# Patient Record
Sex: Female | Born: 1975 | Race: Black or African American | Hispanic: No | Marital: Married | Smoking: Current every day smoker
Health system: Southern US, Community
[De-identification: ages and names within clinical notes are randomized; demographics above are authoritative.]

## PROBLEM LIST (undated history)

## (undated) DIAGNOSIS — T8859XA Other complications of anesthesia, initial encounter: Secondary | ICD-10-CM

## (undated) DIAGNOSIS — D649 Anemia, unspecified: Secondary | ICD-10-CM

## (undated) DIAGNOSIS — R569 Unspecified convulsions: Secondary | ICD-10-CM

## (undated) DIAGNOSIS — J45909 Unspecified asthma, uncomplicated: Secondary | ICD-10-CM

## (undated) HISTORY — PX: OTHER SURGICAL HISTORY: SHX169

## (undated) HISTORY — PX: IMPLANTATION VAGAL NERVE STIMULATOR: SUR692

---

## 2020-02-24 ENCOUNTER — Other Ambulatory Visit: Payer: Self-pay

## 2020-02-24 ENCOUNTER — Other Ambulatory Visit (HOSPITAL_COMMUNITY): Payer: Self-pay | Admitting: Preventative Medicine

## 2020-02-24 ENCOUNTER — Ambulatory Visit (HOSPITAL_COMMUNITY)
Admission: RE | Admit: 2020-02-24 | Discharge: 2020-02-24 | Disposition: A | Discharge status: Home / Self Care | Payer: Medicare Other | Source: Ambulatory Visit | Attending: Preventative Medicine | Admitting: Preventative Medicine

## 2020-02-24 DIAGNOSIS — M79642 Pain in left hand: Secondary | ICD-10-CM

## 2020-03-02 DIAGNOSIS — S5332XA Traumatic rupture of left ulnar collateral ligament, initial encounter: Secondary | ICD-10-CM | POA: Insufficient documentation

## 2020-03-02 DIAGNOSIS — S63642A Sprain of metacarpophalangeal joint of left thumb, initial encounter: Secondary | ICD-10-CM | POA: Insufficient documentation

## 2020-03-03 ENCOUNTER — Other Ambulatory Visit: Payer: Self-pay | Admitting: Orthopedic Surgery

## 2020-03-04 ENCOUNTER — Other Ambulatory Visit: Payer: Self-pay | Admitting: Orthopedic Surgery

## 2020-03-04 DIAGNOSIS — S5332XA Traumatic rupture of left ulnar collateral ligament, initial encounter: Secondary | ICD-10-CM

## 2020-03-04 DIAGNOSIS — S63642A Sprain of metacarpophalangeal joint of left thumb, initial encounter: Secondary | ICD-10-CM

## 2020-03-18 ENCOUNTER — Other Ambulatory Visit: Payer: Self-pay | Admitting: Orthopedic Surgery

## 2020-03-18 ENCOUNTER — Other Ambulatory Visit: Payer: Self-pay | Admitting: Hematology & Oncology

## 2020-03-18 DIAGNOSIS — S63642A Sprain of metacarpophalangeal joint of left thumb, initial encounter: Secondary | ICD-10-CM

## 2020-03-18 DIAGNOSIS — S5332XA Traumatic rupture of left ulnar collateral ligament, initial encounter: Secondary | ICD-10-CM

## 2020-03-30 ENCOUNTER — Inpatient Hospital Stay: Admission: RE | Admit: 2020-03-30 | Payer: Medicare Other | Source: Ambulatory Visit

## 2020-04-24 ENCOUNTER — Other Ambulatory Visit: Payer: Self-pay | Admitting: Orthopedic Surgery

## 2020-04-24 DIAGNOSIS — S5332XA Traumatic rupture of left ulnar collateral ligament, initial encounter: Secondary | ICD-10-CM

## 2020-04-24 DIAGNOSIS — S63642A Sprain of metacarpophalangeal joint of left thumb, initial encounter: Secondary | ICD-10-CM

## 2020-04-28 ENCOUNTER — Emergency Department (HOSPITAL_COMMUNITY): Payer: Medicare HMO

## 2020-04-28 ENCOUNTER — Other Ambulatory Visit: Payer: Self-pay

## 2020-04-28 ENCOUNTER — Encounter (HOSPITAL_COMMUNITY): Payer: Self-pay

## 2020-04-28 ENCOUNTER — Emergency Department (HOSPITAL_COMMUNITY)
Admission: EM | Admit: 2020-04-28 | Discharge: 2020-04-28 | Disposition: A | Discharge status: Home / Self Care | Payer: Medicare HMO | Attending: Emergency Medicine | Admitting: Emergency Medicine

## 2020-04-28 DIAGNOSIS — R059 Cough, unspecified: Secondary | ICD-10-CM | POA: Insufficient documentation

## 2020-04-28 DIAGNOSIS — R0989 Other specified symptoms and signs involving the circulatory and respiratory systems: Secondary | ICD-10-CM | POA: Diagnosis not present

## 2020-04-28 DIAGNOSIS — Z5321 Procedure and treatment not carried out due to patient leaving prior to being seen by health care provider: Secondary | ICD-10-CM | POA: Insufficient documentation

## 2020-04-28 DIAGNOSIS — Z20822 Contact with and (suspected) exposure to covid-19: Secondary | ICD-10-CM | POA: Diagnosis not present

## 2020-04-28 DIAGNOSIS — R1310 Dysphagia, unspecified: Secondary | ICD-10-CM

## 2020-04-28 HISTORY — DX: Unspecified convulsions: R56.9

## 2020-04-28 LAB — CBC WITH DIFFERENTIAL/PLATELET
Abs Immature Granulocytes: 0.02 10*3/uL (ref 0.00–0.07)
Basophils Absolute: 0.1 10*3/uL (ref 0.0–0.1)
Basophils Relative: 1 %
Eosinophils Absolute: 0.1 10*3/uL (ref 0.0–0.5)
Eosinophils Relative: 2 %
HCT: 35.5 % — ABNORMAL LOW (ref 36.0–46.0)
Hemoglobin: 10.8 g/dL — ABNORMAL LOW (ref 12.0–15.0)
Immature Granulocytes: 0 %
Lymphocytes Relative: 18 %
Lymphs Abs: 1.3 10*3/uL (ref 0.7–4.0)
MCH: 22.4 pg — ABNORMAL LOW (ref 26.0–34.0)
MCHC: 30.4 g/dL (ref 30.0–36.0)
MCV: 73.7 fL — ABNORMAL LOW (ref 80.0–100.0)
Monocytes Absolute: 0.5 10*3/uL (ref 0.1–1.0)
Monocytes Relative: 7 %
Neutro Abs: 5 10*3/uL (ref 1.7–7.7)
Neutrophils Relative %: 72 %
Platelets: 248 10*3/uL (ref 150–400)
RBC: 4.82 MIL/uL (ref 3.87–5.11)
RDW: 15.1 % (ref 11.5–15.5)
WBC: 7 10*3/uL (ref 4.0–10.5)
nRBC: 0 % (ref 0.0–0.2)

## 2020-04-28 LAB — COMPREHENSIVE METABOLIC PANEL
ALT: 11 U/L (ref 0–44)
AST: 20 U/L (ref 15–41)
Albumin: 3.8 g/dL (ref 3.5–5.0)
Alkaline Phosphatase: 43 U/L (ref 38–126)
Anion gap: 7 (ref 5–15)
BUN: 8 mg/dL (ref 6–20)
CO2: 25 mmol/L (ref 22–32)
Calcium: 8.8 mg/dL — ABNORMAL LOW (ref 8.9–10.3)
Chloride: 106 mmol/L (ref 98–111)
Creatinine, Ser: 0.87 mg/dL (ref 0.44–1.00)
GFR, Estimated: >60 mL/min (ref >60)
Glucose, Bld: 75 mg/dL (ref 70–99)
Potassium: 4.3 mmol/L (ref 3.5–5.1)
Sodium: 138 mmol/L (ref 135–145)
Total Bilirubin: 0.5 mg/dL (ref 0.3–1.2)
Total Protein: 7 g/dL (ref 6.5–8.1)

## 2020-04-28 LAB — RESP PANEL BY RT-PCR (FLU A&B, COVID) ARPGX2
Influenza A by PCR: NEGATIVE
Influenza B by PCR: NEGATIVE
SARS Coronavirus 2 by RT PCR: NEGATIVE

## 2020-04-28 MED ORDER — PANTOPRAZOLE SODIUM 40 MG IV SOLR
40.0000 mg | Freq: Once | INTRAVENOUS | Status: AC
  Administered 2020-04-28: 40 mg via INTRAVENOUS
  Filled 2020-04-28: qty 40

## 2020-04-28 MED ORDER — GLUCAGON HCL RDNA (DIAGNOSTIC) 1 MG IJ SOLR
1.0000 mg | Freq: Once | INTRAMUSCULAR | Status: AC
  Administered 2020-04-28: 1 mg via INTRAVENOUS
  Filled 2020-04-28: qty 1

## 2020-04-28 MED ORDER — SODIUM CHLORIDE 0.9 % IV BOLUS
1000.0000 mL | Freq: Once | INTRAVENOUS | Status: AC
  Administered 2020-04-28: 1000 mL via INTRAVENOUS

## 2020-04-28 NOTE — ED Triage Notes (Signed)
Pt reports productive cough with brownish/green sputum x 3 weeks.  This morning woke up feeling like something is in her throat.  Reports able to swallow liquid but chokes on food.  Reports had covid test 2 weeks ago and was negative.  Denies any fever or chills.  Denies any sob.

## 2020-04-28 NOTE — ED Provider Notes (Signed)
[facility] EMERGENCY DEPARTMENT Provider Note   CSN: [id number] Arrival date & time: 04/28/20  0848     History Chief Complaint  Patient presents with  . Cough    Annette Vasquez is a 45 y.o. female.  Patient states she is having difficulty swallowing.  This has been going on for a number of days.  She can swallow liquids but have problems with food  The history is provided by the patient and medical records.  Sore Throat This is a new problem. The problem occurs constantly. The problem has not changed since onset.Pertinent negatives include no chest pain, no abdominal pain and no headaches. Nothing aggravates the symptoms. Nothing relieves the symptoms. She has tried nothing for the symptoms. The treatment provided no relief.       Past Medical History:  Diagnosis Date  . Seizures [facility])     Patient Active Problem List   Diagnosis Date Noted  . 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 IMPLANTATION VAGAL NERVE STIMULATOR    . r thumb surgery       OB History   No obstetric history on file.     No family history on file.  Social History   Tobacco Use  . Smoking status: Current Every Day Smoker  . Smokeless tobacco: Never Used  Vaping Use  . Vaping Use: Never used  Substance Use Topics  . Alcohol use: Never  . Drug use: Never    Home Medications Prior to Admission medications   Not on File    Allergies    Iodinated diagnostic agents and Betadine [povidone iodine]  Review of Systems   Review of Systems  Constitutional: Negative for appetite change and fatigue.  HENT: Negative for congestion, ear discharge and sinus pressure.        Swallowing problems  Eyes: Negative for discharge.  Respiratory: Negative for cough.   Cardiovascular: Negative for chest pain.  Gastrointestinal: Negative for abdominal pain and diarrhea.  Genitourinary: Negative for frequency and hematuria.   Musculoskeletal: Negative for back pain.  Skin: Negative for rash.  Neurological: Negative for seizures and headaches.  Psychiatric/Behavioral: Negative for hallucinations.    Physical Exam Updated Vital Signs BP 91/60   Pulse 63   Temp 98.2 F (36.8 C) (Oral)   Resp 16   Ht 5\' 3"  (1.6 m)   Wt 54.4 kg   LMP 04/21/2020   SpO2 100%   BMI 21.26 kg/m   Physical Exam Vitals and nursing note reviewed.  Constitutional:      Appearance: She is well-developed.  HENT:     Head: Normocephalic.     Nose: Nose normal.  Eyes:     General: No scleral icterus.    Extraocular Movements: EOM normal.     Conjunctiva/sclera: Conjunctivae normal.  Neck:     Thyroid: No thyromegaly.  Cardiovascular:     Rate and Rhythm: Normal rate and regular rhythm.     Heart sounds: No murmur heard. No friction rub. No gallop.   Pulmonary:     Breath sounds: No stridor. No wheezing or rales.  Chest:     Chest wall: No tenderness.  Abdominal:     General: There is no distension.     Tenderness: There is no abdominal tenderness. There is no rebound.  Musculoskeletal:        General: No edema. Normal range of motion.     Cervical back: Neck supple.  Lymphadenopathy:     Cervical: No cervical adenopathy.  Skin:    Findings: No erythema or rash.  Neurological:     Mental Status: She is alert and oriented to person, place, and time.     Motor: No abnormal muscle tone.     Coordination: Coordination normal.  Psychiatric:        Mood and Affect: Mood and affect normal.        Behavior: Behavior normal.     ED Results / Procedures / Treatments   Labs (all labs ordered are listed, but only abnormal results are displayed) Labs Reviewed  CBC WITH DIFFERENTIAL/PLATELET - Abnormal; Notable for the following components:      Result Value   Hemoglobin 10.8 (*)    HCT 35.5 (*)    MCV 73.7 (*)    MCH 22.4 (*)    All other components within normal limits  COMPREHENSIVE METABOLIC PANEL - Abnormal;  Notable for the following components:   Calcium 8.8 (*)    All other components within normal limits  RESP PANEL BY RT-PCR (FLU A&B, COVID) [id number]    EKG None  Radiology DG Chest Port 1 View  Result Date: 04/28/2020 CLINICAL DATA:  Productive cough. EXAM: PORTABLE CHEST 1 VIEW COMPARISON:  No prior. FINDINGS: Delayed device noted chest. Mediastinum hilar structures normal. No focal alveolar infiltrate. No prominent pleural effusion. No pneumothorax. Upper most portion of the apices not completely imaged. No acute bony abnormality. IMPRESSION: No acute cardiopulmonary disease. Electronically Signed   By: Maisie Fus  Register   On: 04/28/2020 10:19    Procedures Procedures (including critical care time)  Medications Ordered in ED Medications  glucagon (human recombinant) (GLUCAGEN) injection 1 mg (1 mg Intravenous Given 04/28/20 1027)  pantoprazole (PROTONIX) injection 40 mg (40 mg Intravenous Given 04/28/20 1027)  sodium chloride 0.9 % bolus 1,000 mL (0 mLs Intravenous Stopped 04/28/20 1326)    ED Course  I have reviewed the triage vital signs and the nursing notes.  Pertinent labs & imaging results that were available during my care of the patient were reviewed by me and considered in my medical decision making (see chart for details).    MDM Rules/Calculators/A&P                          Patient with difficult swallowing.  She was awaiting a CT of her neck and patient left AMA before she had the CAT scan done Final Clinical Impression(s) / ED Diagnoses Final diagnoses:  None    Rx / DC Orders ED Discharge Orders    None       Bethann Berkshire, MD 05/02/20 1009

## 2020-04-28 NOTE — ED Notes (Signed)
Pt states she feels no different after IV glucagon

## 2020-05-12 ENCOUNTER — Ambulatory Visit
Admission: RE | Admit: 2020-05-12 | Discharge: 2020-05-12 | Disposition: A | Discharge status: Home / Self Care | Payer: Medicare Other | Source: Ambulatory Visit | Attending: Orthopedic Surgery | Admitting: Orthopedic Surgery

## 2020-05-12 DIAGNOSIS — S5332XA Traumatic rupture of left ulnar collateral ligament, initial encounter: Secondary | ICD-10-CM

## 2020-05-12 DIAGNOSIS — S63642A Sprain of metacarpophalangeal joint of left thumb, initial encounter: Secondary | ICD-10-CM

## 2020-05-19 ENCOUNTER — Other Ambulatory Visit: Payer: Self-pay | Admitting: Orthopedic Surgery

## 2020-06-03 ENCOUNTER — Other Ambulatory Visit: Payer: Self-pay

## 2020-06-03 ENCOUNTER — Encounter (HOSPITAL_BASED_OUTPATIENT_CLINIC_OR_DEPARTMENT_OTHER): Payer: Self-pay | Admitting: Orthopedic Surgery

## 2020-06-08 ENCOUNTER — Other Ambulatory Visit (HOSPITAL_COMMUNITY)
Admission: RE | Admit: 2020-06-08 | Discharge: 2020-06-08 | Disposition: A | Discharge status: Home / Self Care | Payer: Medicare HMO | Source: Ambulatory Visit | Attending: Orthopedic Surgery | Admitting: Orthopedic Surgery

## 2020-06-08 DIAGNOSIS — Z01812 Encounter for preprocedural laboratory examination: Secondary | ICD-10-CM | POA: Diagnosis present

## 2020-06-08 DIAGNOSIS — Z20822 Contact with and (suspected) exposure to covid-19: Secondary | ICD-10-CM | POA: Insufficient documentation

## 2020-06-08 LAB — SARS CORONAVIRUS 2 (TAT 6-24 HRS): SARS Coronavirus 2: NEGATIVE

## 2020-06-08 NOTE — Progress Notes (Signed)

## 2020-06-10 ENCOUNTER — Encounter (HOSPITAL_BASED_OUTPATIENT_CLINIC_OR_DEPARTMENT_OTHER): Payer: Self-pay | Admitting: Orthopedic Surgery

## 2020-06-11 ENCOUNTER — Ambulatory Visit (HOSPITAL_BASED_OUTPATIENT_CLINIC_OR_DEPARTMENT_OTHER): Payer: Medicare HMO | Admitting: Certified Registered"

## 2020-06-11 ENCOUNTER — Encounter (HOSPITAL_BASED_OUTPATIENT_CLINIC_OR_DEPARTMENT_OTHER)
Admission: RE | Disposition: A | Discharge status: Home / Self Care | Payer: Self-pay | Source: Home / Self Care | Attending: Orthopedic Surgery

## 2020-06-11 ENCOUNTER — Ambulatory Visit (HOSPITAL_BASED_OUTPATIENT_CLINIC_OR_DEPARTMENT_OTHER)
Admission: RE | Admit: 2020-06-11 | Discharge: 2020-06-11 | Disposition: A | Discharge status: Home / Self Care | Payer: Medicare HMO | Attending: Orthopedic Surgery | Admitting: Orthopedic Surgery

## 2020-06-11 ENCOUNTER — Encounter (HOSPITAL_BASED_OUTPATIENT_CLINIC_OR_DEPARTMENT_OTHER): Payer: Self-pay | Admitting: Orthopedic Surgery

## 2020-06-11 ENCOUNTER — Other Ambulatory Visit: Payer: Self-pay

## 2020-06-11 DIAGNOSIS — Z91041 Radiographic dye allergy status: Secondary | ICD-10-CM | POA: Diagnosis not present

## 2020-06-11 DIAGNOSIS — Z885 Allergy status to narcotic agent status: Secondary | ICD-10-CM | POA: Insufficient documentation

## 2020-06-11 DIAGNOSIS — Z881 Allergy status to other antibiotic agents status: Secondary | ICD-10-CM | POA: Diagnosis not present

## 2020-06-11 DIAGNOSIS — F17200 Nicotine dependence, unspecified, uncomplicated: Secondary | ICD-10-CM | POA: Insufficient documentation

## 2020-06-11 DIAGNOSIS — X58XXXA Exposure to other specified factors, initial encounter: Secondary | ICD-10-CM | POA: Diagnosis not present

## 2020-06-11 DIAGNOSIS — S63642A Sprain of metacarpophalangeal joint of left thumb, initial encounter: Secondary | ICD-10-CM | POA: Diagnosis present

## 2020-06-11 HISTORY — DX: Other complications of anesthesia, initial encounter: T88.59XA

## 2020-06-11 HISTORY — PX: LIGAMENT REPAIR: SHX5444

## 2020-06-11 HISTORY — DX: Unspecified asthma, uncomplicated: J45.909

## 2020-06-11 HISTORY — DX: Anemia, unspecified: D64.9

## 2020-06-11 LAB — POCT PREGNANCY, URINE: Preg Test, Ur: NEGATIVE

## 2020-06-11 SURGERY — REPAIR, LIGAMENT
Anesthesia: Monitor Anesthesia Care | Site: Thumb | Laterality: Left

## 2020-06-11 MED ORDER — PROPOFOL 500 MG/50ML IV EMUL
INTRAVENOUS | Status: AC
  Filled 2020-06-11: qty 50

## 2020-06-11 MED ORDER — FENTANYL CITRATE (PF) 100 MCG/2ML IJ SOLN
INTRAMUSCULAR | Status: AC
  Filled 2020-06-11: qty 2

## 2020-06-11 MED ORDER — PROPOFOL 500 MG/50ML IV EMUL
INTRAVENOUS | Status: DC | PRN
  Administered 2020-06-11: 120 ug/kg/min via INTRAVENOUS
  Administered 2020-06-11: 20 mg via INTRAVENOUS

## 2020-06-11 MED ORDER — CEFAZOLIN SODIUM-DEXTROSE 2-4 GM/100ML-% IV SOLN
INTRAVENOUS | Status: AC
  Filled 2020-06-11: qty 100

## 2020-06-11 MED ORDER — LACTATED RINGERS IV SOLN: INTRAVENOUS | Status: DC

## 2020-06-11 MED ORDER — PHENYLEPHRINE HCL (PRESSORS) 10 MG/ML IV SOLN
INTRAVENOUS | Status: DC | PRN
  Administered 2020-06-11: 40 ug via INTRAVENOUS

## 2020-06-11 MED ORDER — MIDAZOLAM HCL 2 MG/2ML IJ SOLN
2.0000 mg | Freq: Once | INTRAMUSCULAR | Status: AC
  Administered 2020-06-11: 2 mg via INTRAVENOUS

## 2020-06-11 MED ORDER — CEFAZOLIN SODIUM-DEXTROSE 2-4 GM/100ML-% IV SOLN
2.0000 g | INTRAVENOUS | Status: AC
  Administered 2020-06-11: 2 g via INTRAVENOUS

## 2020-06-11 MED ORDER — FENTANYL CITRATE (PF) 100 MCG/2ML IJ SOLN: 25.0000 ug | INTRAMUSCULAR | Status: DC | PRN

## 2020-06-11 MED ORDER — MIDAZOLAM HCL 2 MG/2ML IJ SOLN
INTRAMUSCULAR | Status: AC
  Filled 2020-06-11: qty 2

## 2020-06-11 MED ORDER — HYDROCODONE-ACETAMINOPHEN 5-325 MG PO TABS: 1.0000 | ORAL_TABLET | Freq: Four times a day (QID) | ORAL | 0 refills | Status: AC | PRN

## 2020-06-11 MED ORDER — FENTANYL CITRATE (PF) 100 MCG/2ML IJ SOLN
INTRAMUSCULAR | Status: DC | PRN
  Administered 2020-06-11: 25 ug via INTRAVENOUS

## 2020-06-11 MED ORDER — BUPIVACAINE-EPINEPHRINE (PF) 0.5% -1:200000 IJ SOLN
INTRAMUSCULAR | Status: DC | PRN
  Administered 2020-06-11: 20 mL via PERINEURAL

## 2020-06-11 MED ORDER — 0.9 % SODIUM CHLORIDE (POUR BTL) OPTIME
TOPICAL | Status: DC | PRN
  Administered 2020-06-11: 200 mL

## 2020-06-11 MED ORDER — FENTANYL CITRATE (PF) 100 MCG/2ML IJ SOLN
50.0000 ug | Freq: Once | INTRAMUSCULAR | Status: AC
  Administered 2020-06-11: 50 ug via INTRAVENOUS

## 2020-06-11 MED ORDER — ONDANSETRON HCL 4 MG/2ML IJ SOLN
INTRAMUSCULAR | Status: DC | PRN
  Administered 2020-06-11: 4 mg via INTRAVENOUS

## 2020-06-11 MED ORDER — ONDANSETRON HCL 4 MG/2ML IJ SOLN
INTRAMUSCULAR | Status: AC
  Filled 2020-06-11: qty 2

## 2020-06-11 MED ORDER — ONDANSETRON HCL 4 MG/2ML IJ SOLN: 4.0000 mg | Freq: Four times a day (QID) | INTRAMUSCULAR | Status: DC | PRN

## 2020-06-11 SURGICAL SUPPLY — 81 items
ANCH SUT 2-0 MN NDL DRL PLSTR (Anchor) ×2 IMPLANT
ANCHOR JUGGERKNOT 1.0 1DR 2-0 (Anchor) ×3 IMPLANT
APL PRP STRL LF DISP 70% ISPRP (MISCELLANEOUS) ×2
BAG DECANTER FOR FLEXI CONT (MISCELLANEOUS) IMPLANT
BALL CTTN LRG ABS STRL LF (GAUZE/BANDAGES/DRESSINGS)
BLADE MINI RND TIP GREEN BEAV (BLADE) ×3 IMPLANT
BLADE SURG 15 STRL LF DISP TIS (BLADE) ×2 IMPLANT
BLADE SURG 15 STRL SS (BLADE) ×3
BNDG CMPR 9X4 STRL LF SNTH (GAUZE/BANDAGES/DRESSINGS) ×2
BNDG COHESIVE 3X5 TAN STRL LF (GAUZE/BANDAGES/DRESSINGS) ×3 IMPLANT
BNDG ESMARK 4X9 LF (GAUZE/BANDAGES/DRESSINGS) ×3 IMPLANT
BNDG GAUZE ELAST 4 BULKY (GAUZE/BANDAGES/DRESSINGS) ×3 IMPLANT
CHLORAPREP W/TINT 26 (MISCELLANEOUS) ×3 IMPLANT
CORD BIPOLAR FORCEPS 12FT (ELECTRODE) ×3 IMPLANT
COTTONBALL LRG STERILE PKG (GAUZE/BANDAGES/DRESSINGS) IMPLANT
COVER BACK TABLE 60X90IN (DRAPES) ×3 IMPLANT
COVER MAYO STAND STRL (DRAPES) ×3 IMPLANT
COVER WAND RF STERILE (DRAPES) IMPLANT
CUFF TOURN SGL QUICK 18X4 (TOURNIQUET CUFF) ×3 IMPLANT
DECANTER SPIKE VIAL GLASS SM (MISCELLANEOUS) IMPLANT
DRAPE EXTREMITY T 121X128X90 (DISPOSABLE) ×3 IMPLANT
DRAPE OEC MINIVIEW 54X84 (DRAPES) IMPLANT
DRAPE SURG 17X23 STRL (DRAPES) ×3 IMPLANT
GAUZE 4X4 16PLY RFD (DISPOSABLE) IMPLANT
GAUZE SPONGE 4X4 12PLY STRL (GAUZE/BANDAGES/DRESSINGS) ×3 IMPLANT
GAUZE XEROFORM 1X8 LF (GAUZE/BANDAGES/DRESSINGS) ×3 IMPLANT
GLOVE SURG ORTHO LTX SZ8 (GLOVE) ×3 IMPLANT
GLOVE SURG UNDER POLY LF SZ8.5 (GLOVE) ×3 IMPLANT
GOWN STRL REUS W/ TWL LRG LVL3 (GOWN DISPOSABLE) ×2 IMPLANT
GOWN STRL REUS W/TWL LRG LVL3 (GOWN DISPOSABLE) ×3
GOWN STRL REUS W/TWL XL LVL3 (GOWN DISPOSABLE) ×6 IMPLANT
IV SET EXT 30 76VOL 4 MALE LL (IV SETS) IMPLANT
K-WIRE .035X4 (WIRE) IMPLANT
LOOP VESSEL MAXI BLUE (MISCELLANEOUS) IMPLANT
NEEDLE HYPO 22GX1.5 SAFETY (NEEDLE) IMPLANT
NEEDLE KEITH (NEEDLE) IMPLANT
NEEDLE KEITH SZ10 STRAIGHT (NEEDLE) IMPLANT
NEEDLE PRECISIONGLIDE 27X1.5 (NEEDLE) IMPLANT
NS IRRIG 1000ML POUR BTL (IV SOLUTION) ×3 IMPLANT
PACK BASIN DAY SURGERY FS (CUSTOM PROCEDURE TRAY) ×3 IMPLANT
PAD CAST 3X4 CTTN HI CHSV (CAST SUPPLIES) ×2 IMPLANT
PADDING CAST ABS 3INX4YD NS (CAST SUPPLIES)
PADDING CAST ABS 4INX4YD NS (CAST SUPPLIES) ×1
PADDING CAST ABS COTTON 3X4 (CAST SUPPLIES) IMPLANT
PADDING CAST ABS COTTON 4X4 ST (CAST SUPPLIES) ×2 IMPLANT
PADDING CAST COTTON 3X4 STRL (CAST SUPPLIES) ×3
PASSER SUT SWANSON 36MM LOOP (INSTRUMENTS) IMPLANT
SLEEVE SCD COMPRESS KNEE MED (MISCELLANEOUS) ×3 IMPLANT
SLING ARM IMMOBILIZER LRG (SOFTGOODS) ×3 IMPLANT
SPLINT PLASTER CAST XFAST 3X15 (CAST SUPPLIES) ×20 IMPLANT
SPLINT PLASTER XTRA FASTSET 3X (CAST SUPPLIES) ×10
STOCKINETTE 4X48 STRL (DRAPES) ×3 IMPLANT
SUT CHROMIC 5 0 P 3 (SUTURE) IMPLANT
SUT ETHIBOND 3-0 V-5 (SUTURE) IMPLANT
SUT ETHILON 4 0 PS 2 18 (SUTURE) ×3 IMPLANT
SUT FIBERWIRE 2-0 18 17.9 3/8 (SUTURE)
SUT FIBERWIRE 3-0 18 TAPR NDL (SUTURE)
SUT FIBERWIRE 4-0 18 TAPR NDL (SUTURE)
SUT MERSILENE 2.0 SH NDLE (SUTURE) IMPLANT
SUT MERSILENE 4 0 P 3 (SUTURE) ×3 IMPLANT
SUT MON AB 3-0 SH 27 (SUTURE)
SUT MON AB 3-0 SH27 (SUTURE) IMPLANT
SUT PROLENE 2 0 SH DA (SUTURE) IMPLANT
SUT SILK 2 0 PERMA HAND 18 BK (SUTURE) IMPLANT
SUT SILK 4 0 PS 2 (SUTURE) IMPLANT
SUT STEEL 3 0 (SUTURE) IMPLANT
SUT VIC AB 3-0 PS1 18 (SUTURE)
SUT VIC AB 3-0 PS1 18XBRD (SUTURE) IMPLANT
SUT VIC AB 4-0 P-3 18XBRD (SUTURE) IMPLANT
SUT VIC AB 4-0 P2 18 (SUTURE) IMPLANT
SUT VIC AB 4-0 P3 18 (SUTURE)
SUT VICRYL 4-0 PS2 18IN ABS (SUTURE) IMPLANT
SUTURE FIBERWR 2-0 18 17.9 3/8 (SUTURE) IMPLANT
SUTURE FIBERWR 3-0 18 TAPR NDL (SUTURE) IMPLANT
SUTURE FIBERWR 4-0 18 TAPR NDL (SUTURE) IMPLANT
SYR BULB EAR ULCER 3OZ GRN STR (SYRINGE) ×3 IMPLANT
SYR CONTROL 10ML LL (SYRINGE) IMPLANT
SYR TOOMEY 50ML (SYRINGE) IMPLANT
TOWEL GREEN STERILE FF (TOWEL DISPOSABLE) ×6 IMPLANT
TUBE FEEDING ENTERAL 5FR 16IN (TUBING) IMPLANT
UNDERPAD 30X36 HEAVY ABSORB (UNDERPADS AND DIAPERS) ×3 IMPLANT

## 2020-06-11 NOTE — Anesthesia Preprocedure Evaluation (Signed)
Anesthesia Evaluation  Patient identified by MRN, date of birth, ID band Patient awake    Reviewed: Allergy & Precautions, H&P , NPO status , Patient's Chart, lab work & pertinent test results  Airway Mallampati: II   Neck ROM: full    Dental   Pulmonary asthma , Current Smoker and Patient abstained from smoking.,    breath sounds clear to auscultation       Cardiovascular negative cardio ROS   Rhythm:regular Rate:Normal     Neuro/Psych Seizures -,  Vagal nerve stimulator    GI/Hepatic   Endo/Other    Renal/GU      Musculoskeletal   Abdominal   Peds  Hematology   Anesthesia Other Findings   Reproductive/Obstetrics                             Anesthesia Physical Anesthesia Plan  ASA: II  Anesthesia Plan: Regional and MAC   Post-op Pain Management:    Induction: Intravenous  PONV Risk Score and Plan: 1 and Propofol infusion, Ondansetron, Midazolam and Treatment may vary due to age or medical condition  Airway Management Planned: Simple Face Mask  Additional Equipment:   Intra-op Plan:   Post-operative Plan:   Informed Consent: I have reviewed the patients History and Physical, chart, labs and discussed the procedure including the risks, benefits and alternatives for the proposed anesthesia with the patient or authorized representative who has indicated his/her understanding and acceptance.     Dental advisory given  Plan Discussed with: CRNA, Anesthesiologist and Surgeon  Anesthesia Plan Comments:         Anesthesia Quick Evaluation

## 2020-06-11 NOTE — Progress Notes (Signed)
Assisted Dr. Hodierne with left, ultrasound guided, infraclavicular block. Side rails up, monitors on throughout procedure. See vital signs in flow sheet. Tolerated Procedure well. 

## 2020-06-11 NOTE — Transfer of Care (Signed)
Immediate Anesthesia Transfer of Care Note  Patient: Annette Vasquez  Procedure(s) Performed: ULNAR COLLATERAL LIGAMENT METACARPAL PHALANGEAL REPAIR/RECONSTRUCTION (Left Thumb)  Patient Location: PACU  Anesthesia Type:MAC and Regional  Level of Consciousness: drowsy  Airway & Oxygen Therapy: Patient Spontanous Breathing and Patient connected to face mask oxygen  Post-op Assessment: Report given to RN and Post -op Vital signs reviewed and stable  Post vital signs: Reviewed and stable  Last Vitals:  Vitals Value Taken Time  BP 94/52 06/11/20 1115  Temp    Pulse 63 06/11/20 1115  Resp 12 06/11/20 1115  SpO2 100 % 06/11/20 1115  Vitals shown include unvalidated device data.  Last Pain:  Vitals:   06/11/20 0824  TempSrc: Oral  PainSc: 3       Patients Stated Pain Goal: 3 (53/66/44 0347)  Complications: No complications documented.

## 2020-06-11 NOTE — Op Note (Signed)
I assisted Surgeon(s) and Role:    * Cindee Salt, MD - Primary    Betha Loa, MD on the Procedure(s): ULNAR COLLATERAL LIGAMENT METACARPAL PHALANGEAL REPAIR/RECONSTRUCTION on 06/11/2020.  I provided assistance on this case as follows: retraction soft tissues.  Electronically signed by: Betha Loa, MD Date: 06/11/2020 Time: 11:06 AM

## 2020-06-11 NOTE — H&P (Signed)
  Annette Vasquez is an 45 y.o. female.   Chief Complaint:instability left thumb HPI: Annette Vasquez is a 45 year old complaining of left hand pain. S he was seen in the emergency room for x-rays x2 which were negative. She was placed in a thumb spica splint.  she has moved here from [place] was seen in the emergency room on the eighth and referred. Complains of a dull aching pain at the metacarpal phalangeal joint left thumb with a VAS score 5/10 she is not taking anything other than ibuprofen at the present time The injury occurred in October. She continues to have stiffness and pain to the thumb. She continues complain of the discomfort was sent for MRI which was unable to be done and subsequently has had an ultrasound done this was due to a nerve stimulator. The ultrasound reveals a tear high-grade of the metacarpal head ulnar collateral ligament of her left thumb this was read out by Dr. Charise Killian. She had a similar problem on her right side treated by reconstruction which failed resulting in a fusion and this was done in [place]. She has no history of diabetes thyroid arthritis or gout. Family history is positive diabetes negative for the remainder. She has been wearing a splint which needs to be revised.    Past Medical History:  Diagnosis Date  . Anemia   . Complication of anesthesia    hard to wake up  . Seizures (HCC)     Past Surgical History:  Procedure Laterality Date  . IMPLANTATION VAGAL NERVE STIMULATOR    . r thumb surgery      History reviewed. No pertinent family history. Social History:  reports that she has been smoking. She has never used smokeless tobacco. She reports that she does not drink alcohol and does not use drugs.  Allergies:  Allergies  Allergen Reactions  . Iodinated Diagnostic Agents Anaphylaxis    Pt states anaphylaxis to IV contrast.   . Betadine [Povidone Iodine]   . Ciprofloxacin Hives  . Morphine And Related Other (See Comments)    hyperactive  . Percocet  [Oxycodone-Acetaminophen] Hives  . Zoloft [Sertraline Hcl] Swelling    No medications prior to admission.    No results found for this or any previous visit (from the past 48 hour(s)).  No results found.   Pertinent items are noted in HPI.  Height 5\' 3"  (1.6 m), weight 54.4 kg.  General appearance: alert, cooperative and appears stated age Head: Normocephalic, without obvious abnormality Neck: no JVD Resp: clear to auscultation bilaterally Cardio: regular rate and rhythm, S1, S2 normal, no murmur, click, rub or gallop GI: soft, non-tender; bowel sounds normal; no masses,  no organomegaly Extremities: instability left thumb Pulses: 2+ and symmetric Skin: Skin color, texture, turgor normal. No rashes or lesions Neurologic: Grossly normal Incision/Wound: na  Assessment/Plan Assessment:  1. Gamekeeper's thumb of left hand   Plan:  We are going to schedule her for reconstruction of the collateral ligament with a possible graft using extensor tendon from the ring finger. Prepare postoperative course are discussed along with risks and complications. She is aware there is no guarantee to the surgery possibility of infection recurrence injury to arteries nerves tendons complete relief symptoms dystrophy the possibility of this require fusion in the future. This will be scheduled as an outpatient under regional anesthesia.    06/11/2020, 5:00 AM

## 2020-06-11 NOTE — Discharge Instructions (Addendum)
Regional Anesthesia Blocks ° °1. Numbness or the inability to move the "blocked" extremity may last from 3-48 hours after placement. The length of time depends on the medication injected and your individual response to the medication. If the numbness is not going away after 48 hours, call your surgeon. ° °2. The extremity that is blocked will need to be protected until the numbness is gone and the  Strength has returned. Because you cannot feel it, you will need to take extra care to avoid injury. Because it may be weak, you may have difficulty moving it or using it. You may not know what position it is in without looking at it while the block is in effect. ° °3. For blocks in the legs and feet, returning to weight bearing and walking needs to be done carefully. You will need to wait until the numbness is entirely gone and the strength has returned. You should be able to move your leg and foot normally before you try and bear weight or walk. You will need someone to be with you when you first try to ensure you do not fall and possibly risk injury. ° °4. Bruising and tenderness at the needle site are common side effects and will resolve in a few days. ° °5. Persistent numbness or new problems with movement should be communicated to the surgeon or the [facility] ([phone])/ [facility] ([phone]). ° ° ° °Post Anesthesia Home Care Instructions ° °Activity: °Get plenty of rest for the remainder of the day. A responsible individual must stay with you for 24 hours following the procedure.  °For the next 24 hours, DO NOT: °-Drive a car °-Operate machinery °-Drink alcoholic beverages °-Take any medication unless instructed by your physician °-Make any legal decisions or sign important papers. ° °Meals: °Start with liquid foods such as gelatin or soup. Progress to regular foods as tolerated. Avoid greasy, spicy, heavy foods. If nausea and/or vomiting occur, drink only clear liquids until the  nausea and/or vomiting subsides. Call your physician if vomiting continues. ° °Special Instructions/Symptoms: °Your throat may feel dry or sore from the anesthesia or the breathing tube placed in your throat during surgery. If this causes discomfort, gargle with warm salt water. The discomfort should disappear within 24 hours. ° °If you had a scopolamine patch placed behind your ear for the management of post- operative nausea and/or vomiting: ° °1. The medication in the patch is effective for 72 hours, after which it should be removed.  Wrap patch in a tissue and discard in the trash. Wash hands thoroughly with soap and water. °2. You may remove the patch earlier than 72 hours if you experience unpleasant side effects which may include dry mouth, dizziness or visual disturbances. °3. Avoid touching the patch. Wash your hands with soap and water after contact with the patch. °  °Hand Center Instructions °Hand Surgery ° °Wound Care: °Keep your hand elevated above the level of your heart.  Do not allow it to dangle by your side.  Keep the dressing dry and do not remove it unless your doctor advises you to do so.  He will usually change it at the time of your post-op visit.  Moving your fingers is advised to stimulate circulation but will depend on the site of your surgery.  If you have a splint applied, your doctor will advise you regarding movement. ° °Activity: °Do not drive or operate machinery today.  Rest today and then you may   return to your normal activity and work as indicated by your physician. ° °Diet:  °Drink liquids today or eat a light diet.  You may resume a regular diet tomorrow.   ° °General expectations: °Pain for two to three days. °Fingers may become slightly swollen. ° °Call your doctor if any of the following occur: °Severe pain not relieved by pain medication. °Elevated temperature. °Dressing soaked with blood. °Inability to move fingers. °White or bluish color to fingers. ° °

## 2020-06-11 NOTE — Anesthesia Procedure Notes (Signed)
Anesthesia Regional Block: Infraclavicular brachial plexus block   Pre-Anesthetic Checklist: ,, timeout performed, Correct Patient, Correct Site, Correct Laterality, Correct Procedure, Correct Position, site marked, Risks and benefits discussed,  Surgical consent,  Pre-op evaluation,  At surgeon's request and post-op pain management  Laterality: Left  Prep: chloraprep       Needles:  Injection technique: Single-shot  Needle Type: Echogenic Stimulator Needle     Needle Length: 5cm  Needle Gauge: 22     Additional Needles:   Procedures:, nerve stimulator,,,,,,,   Nerve Stimulator or Paresthesia:  Response: biceps flexion, 0.45 mA,   Additional Responses:   Narrative:  Start time: 06/11/2020 9:30 AM End time: 06/11/2020 9:41 AM Injection made incrementally with aspirations every 5 mL.  Performed by: Personally  Anesthesiologist: Achille Rich, MD  Additional Notes: Functioning IV was confirmed and monitors were applied.  A 57mm 22ga Arrow echogenic stimulator needle was used. Sterile prep and drape,hand hygiene and sterile gloves were used.  Negative aspiration and negative test dose prior to incremental administration of local anesthetic. The patient tolerated the procedure well.  Ultrasound guidance: relevent anatomy identified, needle position confirmed, local anesthetic spread visualized around nerve(s), vascular puncture avoided.  Image printed for medical record.

## 2020-06-11 NOTE — Brief Op Note (Signed)
06/11/2020  11:06 AM  PATIENT:  Annette Vasquez  45 y.o. female  PRE-OPERATIVE DIAGNOSIS:  TEAR OF ULNAR COLLATERAL LIGAMENT METACARPAL PHALANGEAL LEFT THUMB  POST-OPERATIVE DIAGNOSIS:  TEAR OF ULNAR COLLATERAL LIGAMENT METACARPAL PHALANGEAL LEFT THUMB  PROCEDURE:  Procedure(s): ULNAR COLLATERAL LIGAMENT METACARPAL PHALANGEAL REPAIR/RECONSTRUCTION (Left)  SURGEON:  Surgeon(s) and Role:    * Cindee Salt, MD - Primary    * Betha Loa, MD  PHYSICIAN ASSISTANT:   ASSISTANTS: K Anushka Hartinger<MD   ANESTHESIA:   regional and IV sedation  EBL: 28ml  BLOOD ADMINISTERED:none  DRAINS: none   LOCAL MEDICATIONS USED:  NONE  SPECIMEN:  No Specimen  DISPOSITION OF SPECIMEN:  N/A  COUNTS:  YES  TOURNIQUET:   Total Tourniquet Time Documented: Upper Arm (Left) - 35 minutes Total: Upper Arm (Left) - 35 minutes   DICTATION: .Reubin Milan Dictation  PLAN OF CARE: Discharge to home after PACU  PATIENT DISPOSITION:  PACU - hemodynamically stable.

## 2020-06-11 NOTE — Anesthesia Postprocedure Evaluation (Signed)
Anesthesia Post Note  Patient: Annette Vasquez  Procedure(s) Performed: ULNAR COLLATERAL LIGAMENT METACARPAL PHALANGEAL REPAIR/RECONSTRUCTION (Left Thumb)     Patient location during evaluation: PACU Anesthesia Type: Regional and MAC Level of consciousness: awake and alert Pain management: pain level controlled Vital Signs Assessment: post-procedure vital signs reviewed and stable Respiratory status: spontaneous breathing, nonlabored ventilation, respiratory function stable and patient connected to nasal cannula oxygen Cardiovascular status: stable and blood pressure returned to baseline Postop Assessment: no apparent nausea or vomiting Anesthetic complications: no   No complications documented.  Last Vitals:  Vitals:   06/11/20 1151 06/11/20 1200  BP: (!) 90/58   Pulse: 81   Resp:    Temp: 36.6 C   SpO2: 98% 100%    Last Pain:  Vitals:   06/11/20 1151  TempSrc:   PainSc: 0-No pain                 Monaye Blackie S

## 2020-06-11 NOTE — Op Note (Signed)
NAME: Annette Vasquez MEDICAL RECORD NO: [id number] DATE OF BIRTH: February 28, 1976 FACILITY: [facility] LOCATION: [facility] PHYSICIAN: Nicki Reaper, MD   OPERATIVE REPORT   DATE OF PROCEDURE: 06/11/20    PREOPERATIVE DIAGNOSIS:   Gamekeeper's thumb left hand   POSTOPERATIVE DIAGNOSIS:   Same   PROCEDURE:   Repair ulnar collateral ligament metacarpal phalangeal joint left thumb   SURGEON: Cindee Salt, M.D.   ASSISTANT: Betha Loa, MD   ANESTHESIA:  Regional with sedation   INTRAVENOUS FLUIDS:  Per anesthesia flow sheet.   ESTIMATED BLOOD LOSS:  Minimal.   COMPLICATIONS:  None.   SPECIMENS:  none   TOURNIQUET TIME:    Total Tourniquet Time Documented: Upper Arm (Left) - 35 minutes Total: Upper Arm (Left) - 35 minutes    DISPOSITION:  Stable to PACU.   INDICATIONS: Patient is a 45 year old female with a history of injury to the metacarpal phalangeal joint of her left thumb with instability.  Ultrasound reveals a avulsion of the collateral ligament.  MRI was not able to be performed.  Does not respond to conservative treatment.  She has undergone a similar injury on the right hand years ago.  This resulted in however failure of the repair and the necessity of fusion of the metacarpal phalangeal joint.  She is admitted now for repair possible reconstruction ulnar collateral ligament metacarpal phalangeal joint left thumb.  She is aware that there is no guarantee to the surgery the possibility of infection recurrence injury to arteries nerves tendons incomplete relief symptoms dystrophy.  Preoperative area the patient seen the extremity marked by both patient and surgeon antibiotic given.  An infraclavicular block was carried out without difficulty under the direction the anesthesia department.  OPERATIVE COURSE: Patient is brought to the operating room placed in the supine position with the left arm free.  Prep was done with ChloraPrep.  3-minute dry time allowed  timeout taken to confirm patient procedure.  The limb was exsanguinated with an Esmarch bandage tourniquet placed on the arm was inflated to 250 mmHg a curvilinear incision was then made over the metacarpal phalangeal joint with the apex to the ulnar side carried down through subcutaneous tissue.  Bleeders were electrocauterized with bipolar.  Dorsal sensory branches were identified.  The abductor aponeurosis was then incised.  The capsule was opened at the metacarpal phalangeal joint retractors were placed allowing visualization of the collateral ligament was which was detached from the proximal phalanx.  It was firmly attached to the metacarpal head.  The area was then debrided.  A 1 mm juggernaut was then placed into the base of the proximal phalanx with good fixation.  The collateral ligament was then repaired pulling it back down onto the defect in the proximal phalanx.  The wound was copious irrigated with saline.  The capsule was closed with a running 6-0 chromic suture.  The abductor aponeurosis was repaired with a running 4-0 Mersilene suture the skin was then repaired with interrupted 4-0 nylon sutures.  The thumb showed excellent stability to stressing.  A sterile compressive dressing and dorsal palmar thumb spica splint was applied.  Deflation of the tourniquet all fingers immediately pink.  She was taken to the recovery room for observation in satisfactory condition.  She will be discharged home to return to the hand center [facility] in 1 week Tylenol ibuprofen for pain with Norco for breakthrough.  Cindee Salt, MD Electronically signed, 06/11/20

## 2020-06-12 ENCOUNTER — Encounter (HOSPITAL_BASED_OUTPATIENT_CLINIC_OR_DEPARTMENT_OTHER): Payer: Self-pay | Admitting: Orthopedic Surgery

## 2021-09-25 IMAGING — DX DG HAND COMPLETE 3+V*L*
4 series · 4 of 4 positions shown · non-contrast
Comparison: None.

CLINICAL DATA: Football injury 3 weeks ago. First MCP joint region
pain.

EXAM:
LEFT HAND - COMPLETE 3+ VIEW

[hand pa]
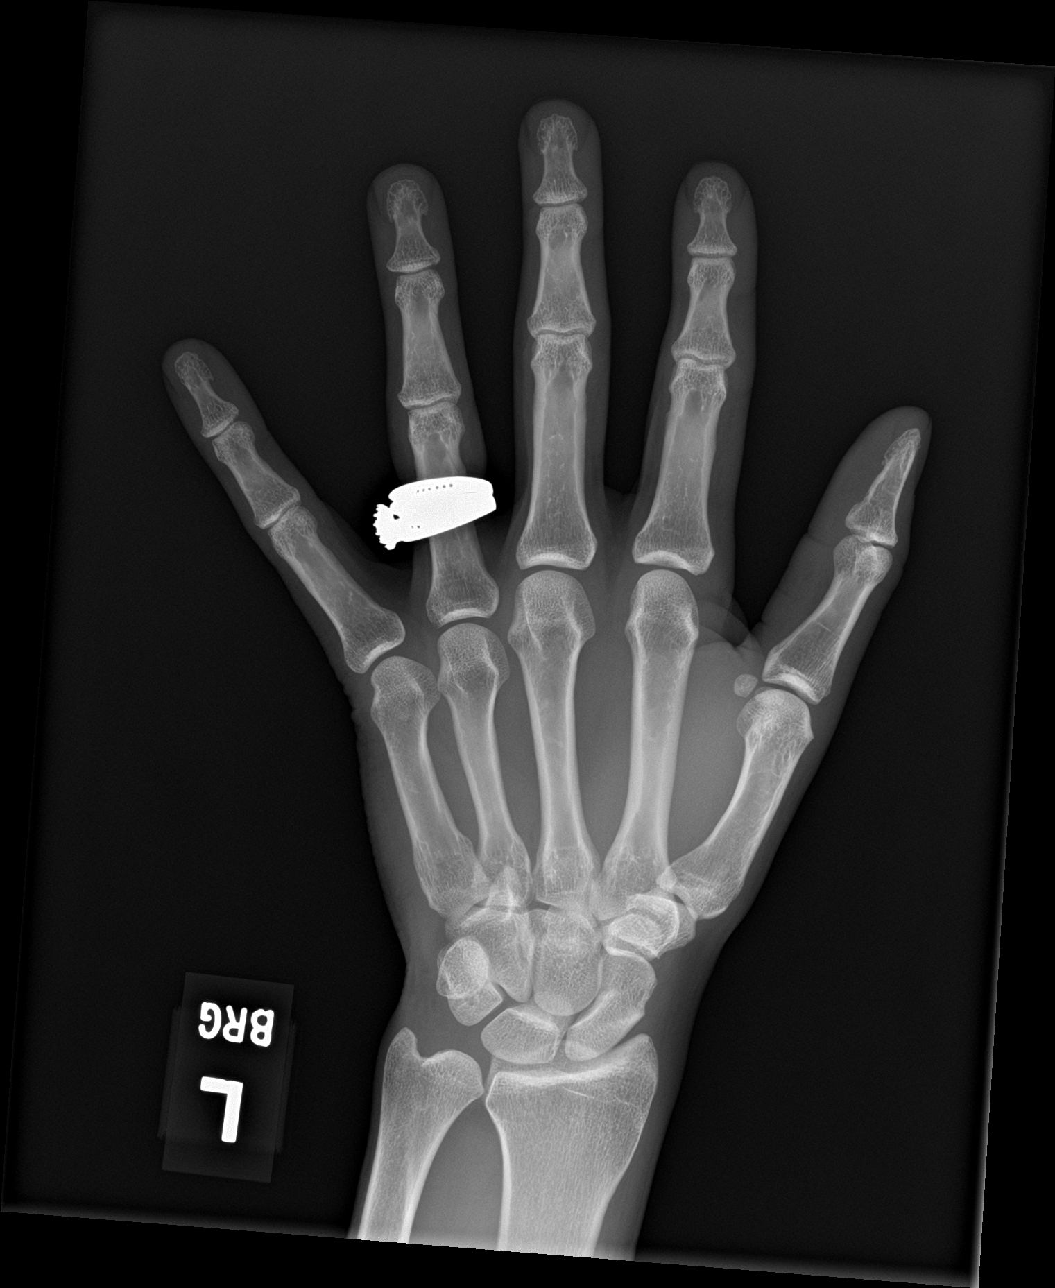

[hand obl]
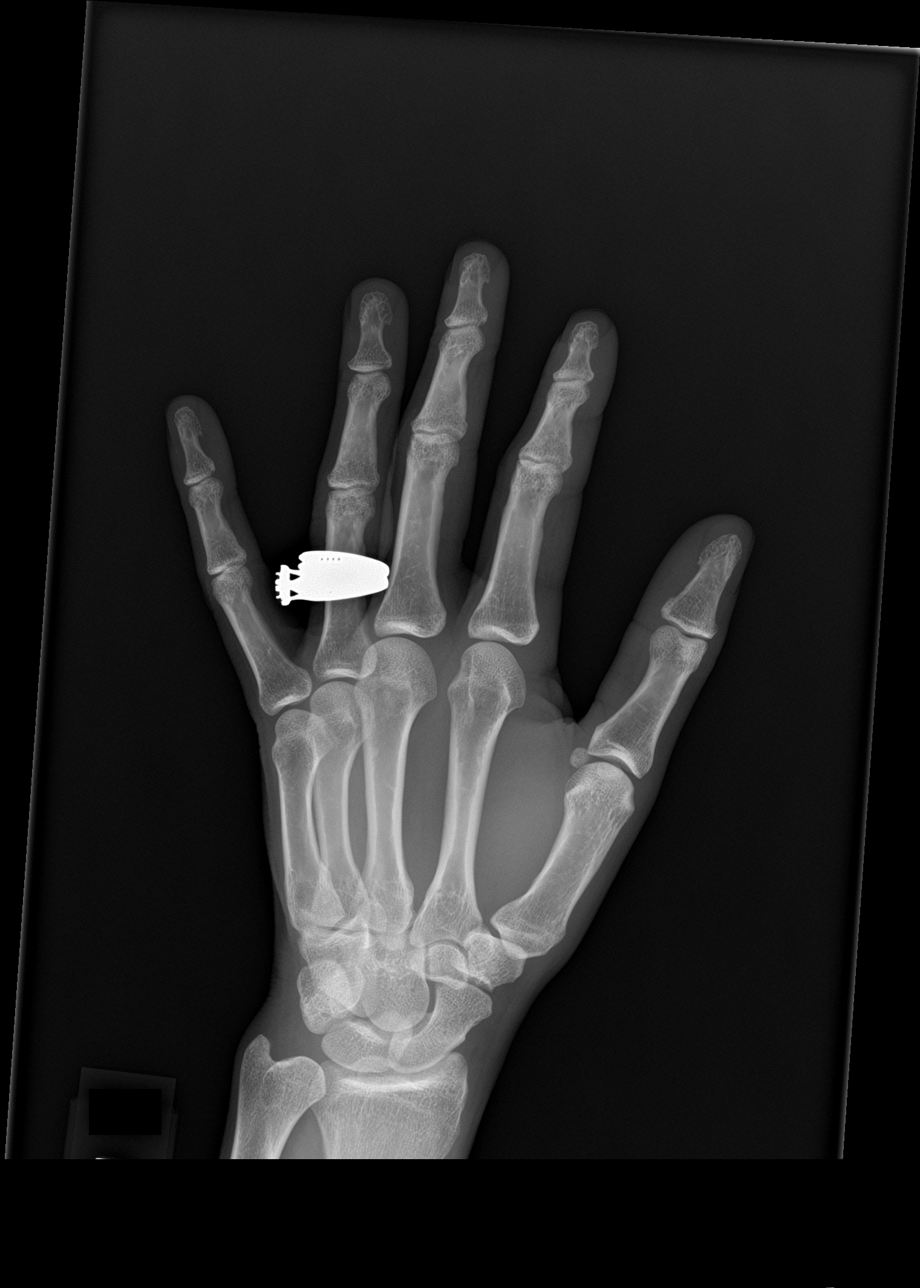

[hand lat (1 of 2)]
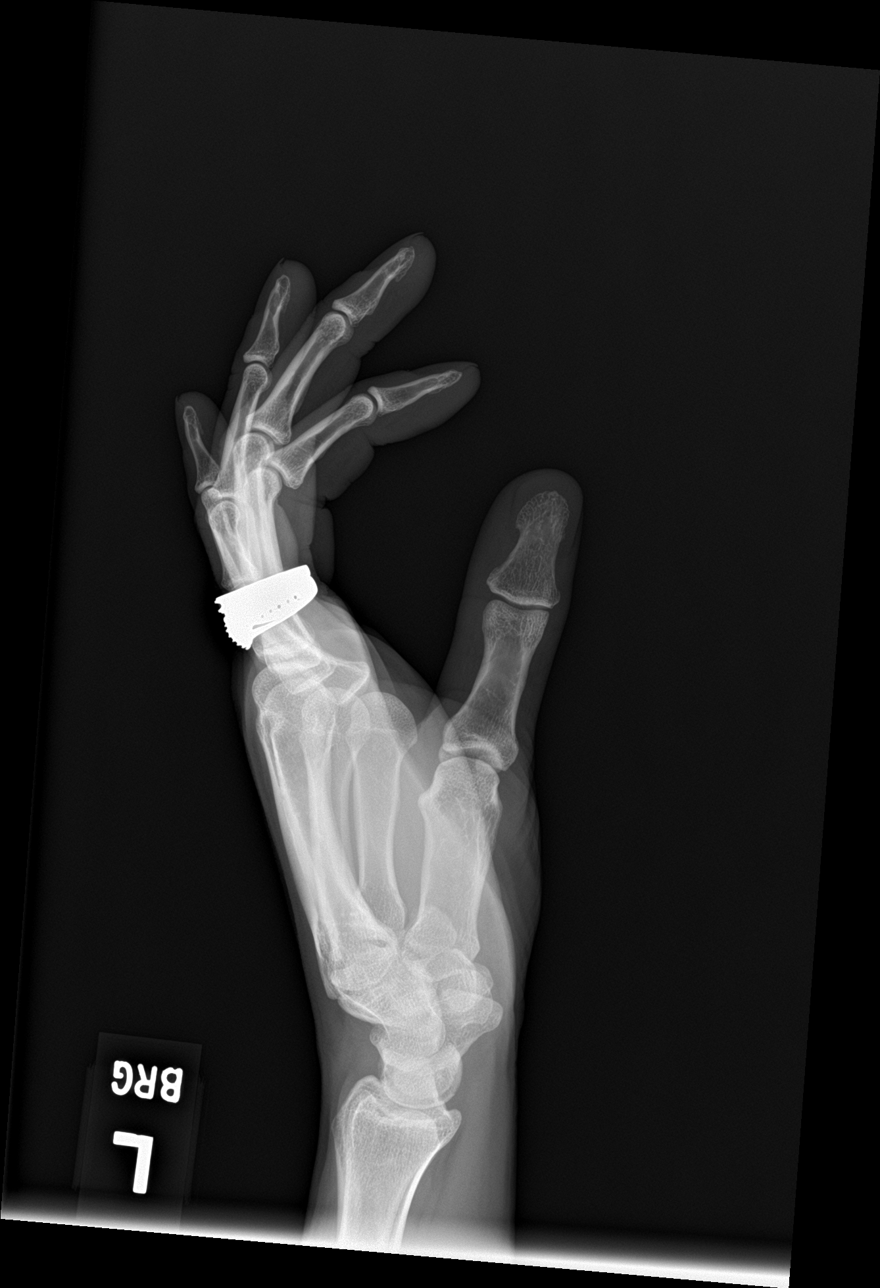

[hand lat (2 of 2)]
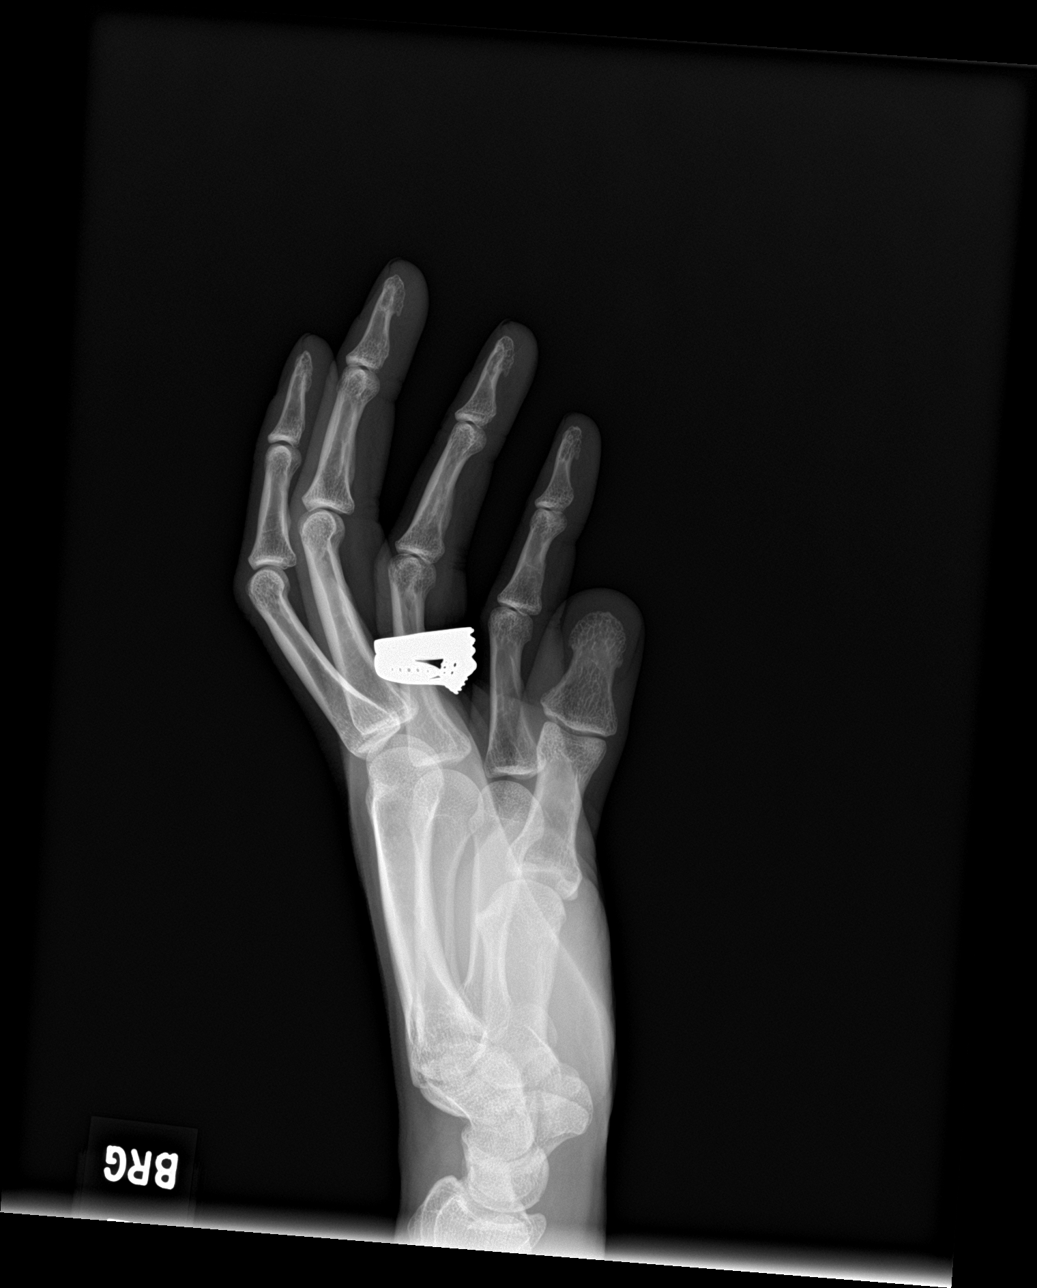

[4 of 4 positions shown; findings below may reference images not displayed]

FINDINGS: There is no evidence of fracture or dislocation. There is no
evidence of arthropathy or other focal bone abnormality. Soft
tissues are unremarkable.
IMPRESSION: Negative.

## 2021-12-12 IMAGING — US US EXTREM UP*L* LTD
1 series · 8 of 8 positions shown · non-contrast
Comparison: Left hand x-rays dated February 24, 2020.

CLINICAL DATA: Left thumb injury catching a football in [REDACTED].
Concern for ulnar collateral ligament injury.

EXAM:
ULTRASOUND LEFT UPPER EXTREMITY LIMITED
TECHNIQUE: Ultrasound examination of the upper extremity soft tissues was
performed in the area of clinical concern.

[Series 1: us extrem up*left* ltd · 0.03mm/px · 8 acquisitions, 8 frames shown]
[im 1/8]
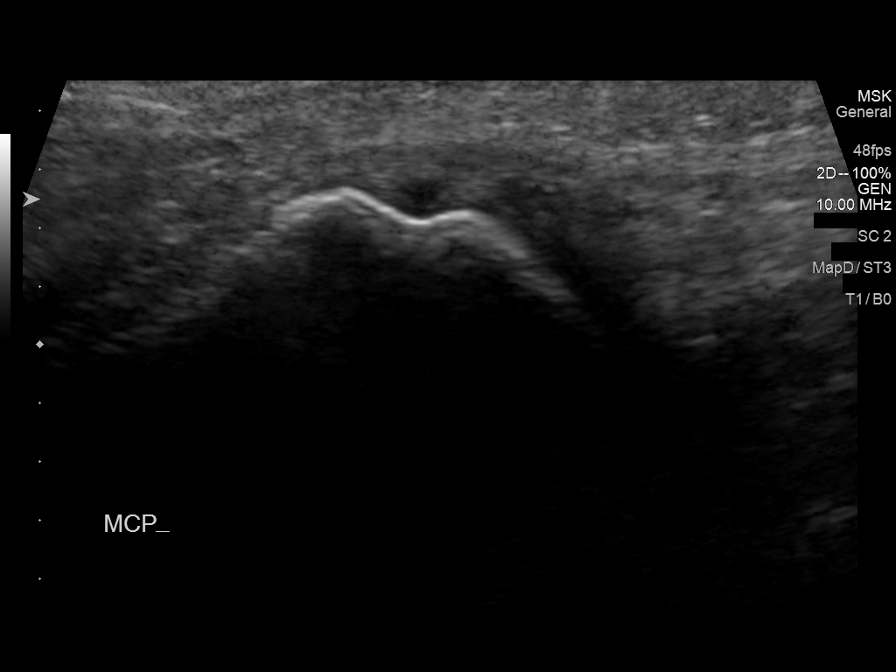
[im 2/8]
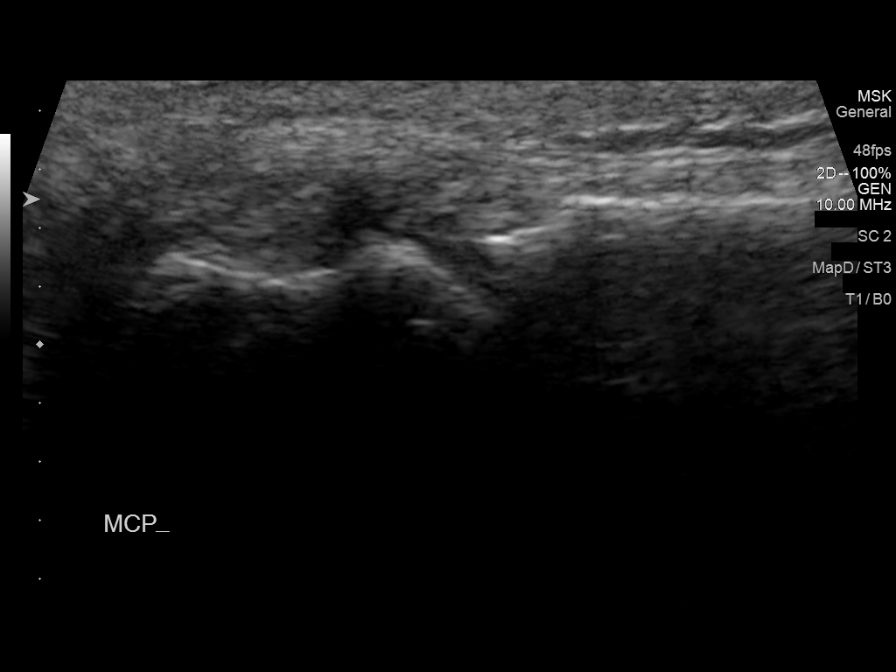
[im 3/8]
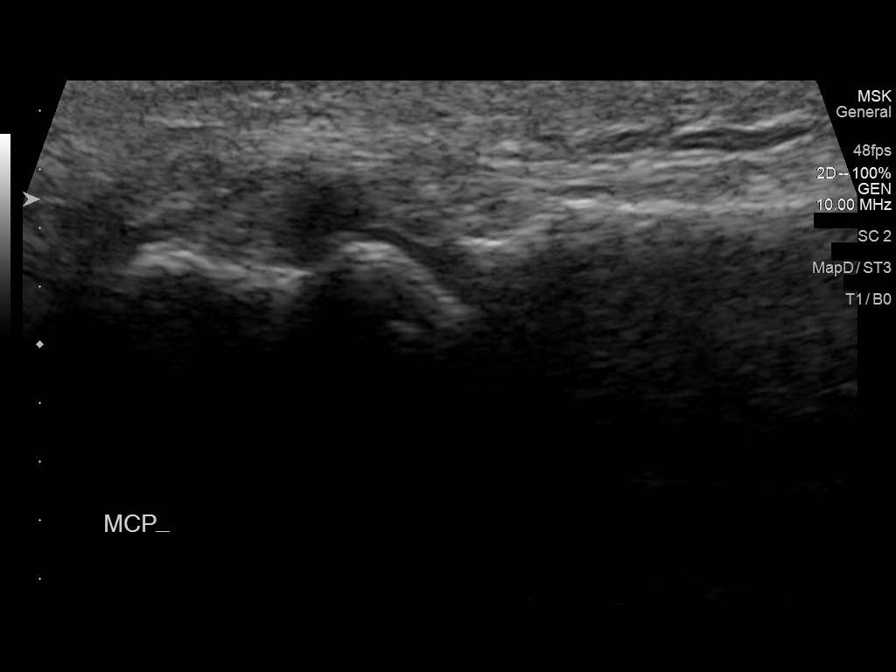
[im 4/8]
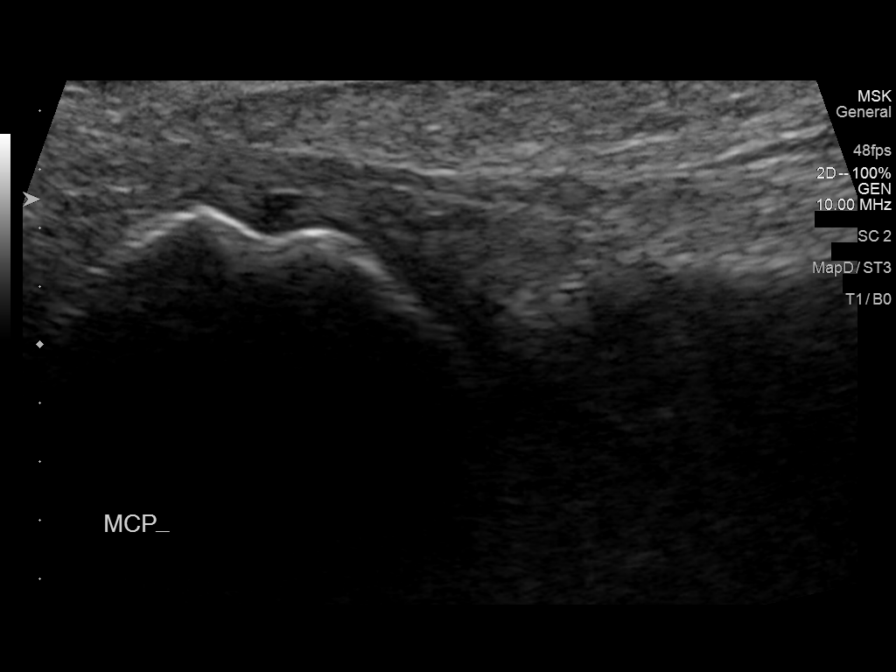
[im 5/8]
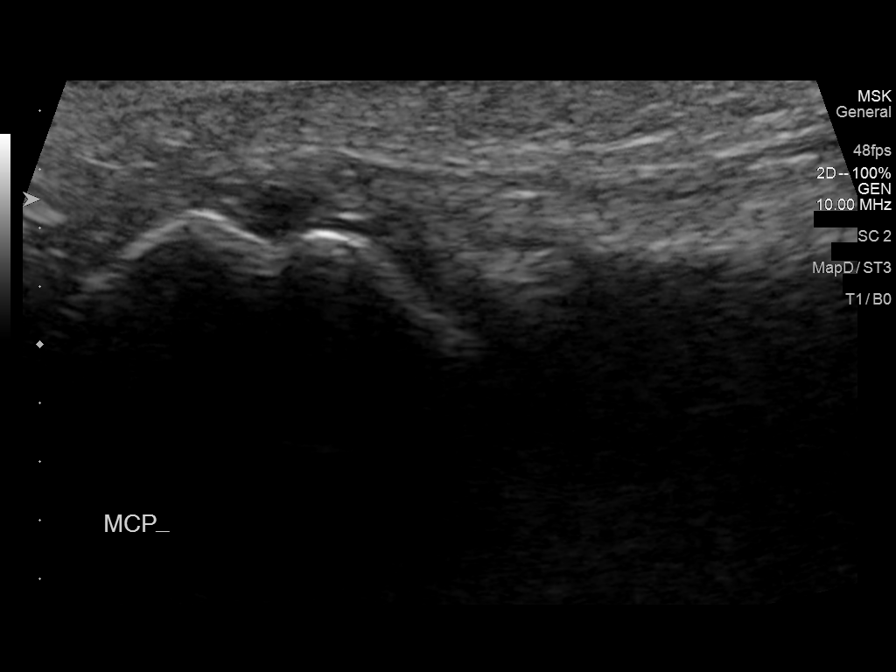
[im 6/8]
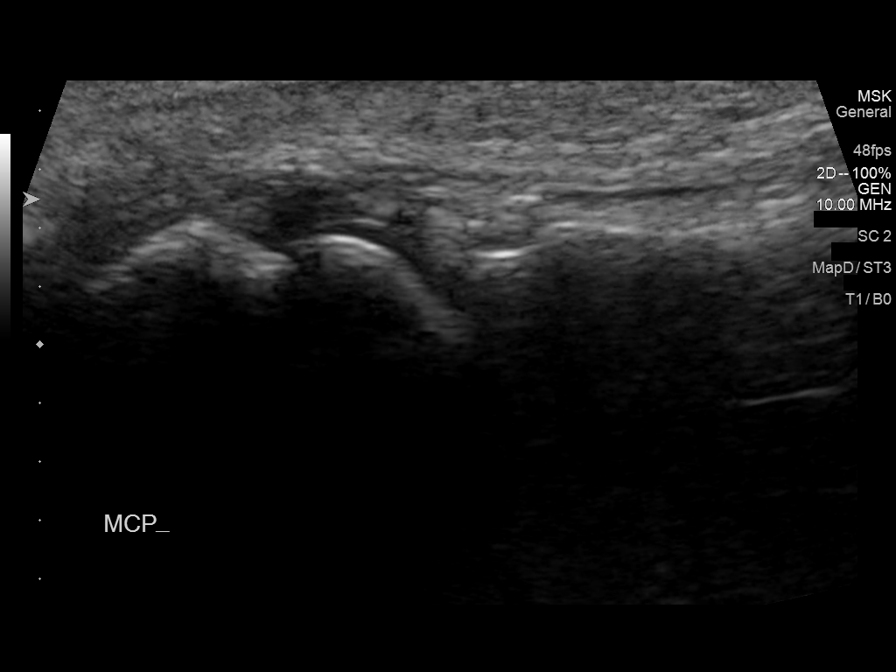
[im 7/8]
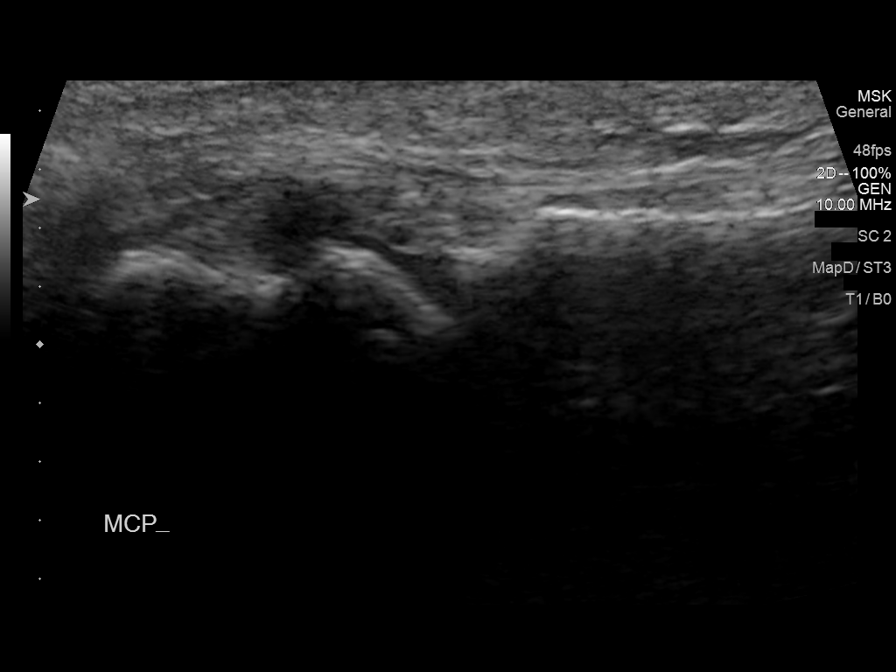
[im 8/8]
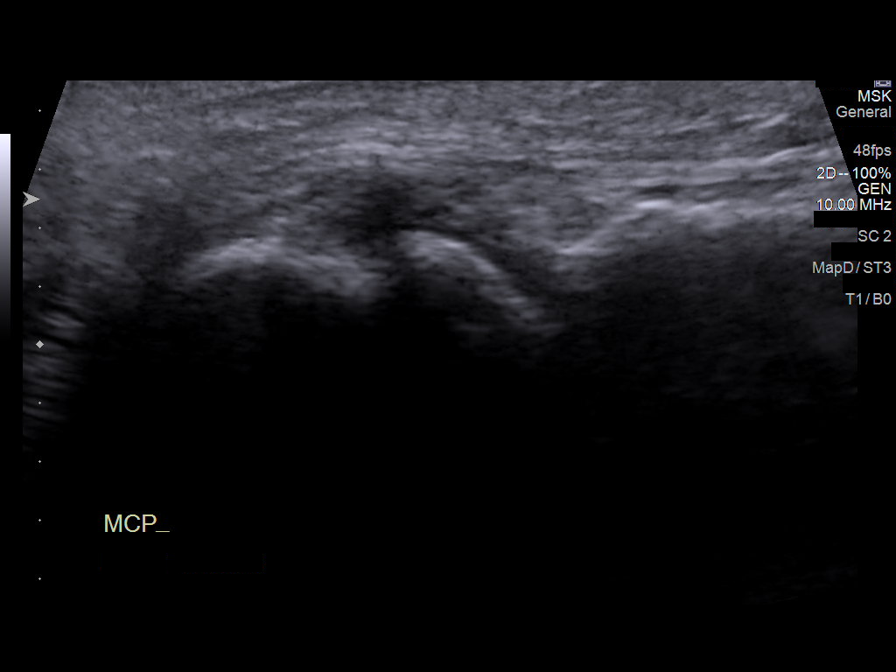

[8 of 8 positions shown; findings below may reference images not displayed]

FINDINGS: Focused ultrasound of the first MCP joint along the ulnar aspect
demonstrates a high-grade partial tear of the proximal ulnar
collateral ligament near its metacarpal attachment. No ligament
retraction.
IMPRESSION: 1. High-grade partial tear of the proximal ulnar collateral
ligament. No Stener lesion.
